# Patient Record
Sex: Male | Born: 1990 | Race: Black or African American | Hispanic: No | Marital: Single | Smoking: Never smoker
Health system: Southern US, Community
[De-identification: ages and names within clinical notes are randomized; demographics above are authoritative.]

## PROBLEM LIST (undated history)

## (undated) DIAGNOSIS — T7840XA Allergy, unspecified, initial encounter: Secondary | ICD-10-CM

## (undated) DIAGNOSIS — R569 Unspecified convulsions: Secondary | ICD-10-CM

## (undated) HISTORY — DX: Allergy, unspecified, initial encounter: T78.40XA

## (undated) HISTORY — DX: Unspecified convulsions: R56.9

---

## 2009-04-12 ENCOUNTER — Emergency Department (HOSPITAL_COMMUNITY): Admission: EM | Admit: 2009-04-12 | Discharge: 2009-04-12 | Payer: Self-pay | Admitting: Family Medicine

## 2013-12-07 ENCOUNTER — Ambulatory Visit (INDEPENDENT_AMBULATORY_CARE_PROVIDER_SITE_OTHER): Payer: 59 | Admitting: Internal Medicine

## 2013-12-07 VITALS — BP 120/84 | HR 62 | Temp 98.0°F | Resp 16 | Ht 71.0 in | Wt 216.0 lb

## 2013-12-07 DIAGNOSIS — H109 Unspecified conjunctivitis: Secondary | ICD-10-CM

## 2013-12-07 MED ORDER — TOBRAMYCIN 0.3 % OP SOLN: 2.0000 [drp] | Freq: Four times a day (QID) | OPHTHALMIC | Status: DC

## 2013-12-07 NOTE — Progress Notes (Signed)
   Subjective:    Patient ID: Lee Edwards, male    DOB: 07-Jan-1991, 23 y.o.   MRN: [id number]  HPI Chief Complaint  Patient presents with  . swollen eyelid    right eye--since yeasterday--itching--taking benadryl   This chart was scribed for Lee Siaobert Akira Perusse, MD by Andrew Auaven Small, ED Scribe. This patient was seen in room 4 and the patient's care was started at 9:50 AM.  HPI Comments: Lee PapasDeandre Disano is a 23 y.o. male who presents to the [facility] complaining of worsening edema to right eye lid. Pt states he had mild swelling to right eye lid last night but woke up this morning with worsening swelling. He reports occasional itching and very mild pain. Pt has taken benadryl. He denies eye drainage.   Past Medical History  Diagnosis Date  . Allergy    Allergies  Allergen Reactions  . Pollen Extract    Prior to Admission medications   Medication Sig Start Date End Date Taking? Authorizing Provider  Loratadine (CLARITIN PO) Take by mouth.   Yes Historical Provider, MD   Review of Systems  Eyes: Positive for pain and itching. Negative for discharge.   Objective:   Physical Exam  Constitutional: He is oriented to person, place, and time. He appears well-developed and well-nourished. No distress.  HENT:  Head: Normocephalic and atraumatic.  Eyes: Conjunctivae and EOM are normal. Pupils are equal, round, and reactive to light. Right conjunctiva is not injected.  Right upper lid is slighty swollen without redness or nodule. Purulent discharge expressed from tear duct of right eye.  Neck: Neck supple.  Cardiovascular: Normal rate.   Pulmonary/Chest: Effort normal.  Musculoskeletal: Normal range of motion.  Neurological: He is alert and oriented to person, place, and time.  Skin: Skin is warm and dry.  Psychiatric: He has a normal mood and affect. His behavior is normal.  Nursing note and vitals reviewed.  Filed Vitals:   12/07/13 0944  BP: 120/84  Pulse: 62    Temp: 98 F (36.7 C)  Resp: 16   Assessment & Plan:   eye/tear duct infection Meds ordered this encounter  Medications  . tobramycin (TOBREX) 0.3 % ophthalmic solution    Sig: Place 2 drops into the right eye every 6 (six) hours.    Dispense:  5 mL    Refill:  0   I personally performed the services described in this documentation, which was scribed in my presence. The recorded information has been reviewed and is accurate. I have completed the patient encounter in its entirety as documented by the scribe, with editing by me where necessary. Tesslyn Baumert P. Merla Richesoolittle, M.D.

## 2014-11-02 ENCOUNTER — Ambulatory Visit (INDEPENDENT_AMBULATORY_CARE_PROVIDER_SITE_OTHER): Payer: 59

## 2014-11-02 ENCOUNTER — Ambulatory Visit (INDEPENDENT_AMBULATORY_CARE_PROVIDER_SITE_OTHER): Payer: 59 | Admitting: Emergency Medicine

## 2014-11-02 ENCOUNTER — Inpatient Hospital Stay: Admission: RE | Admit: 2014-11-02 | Payer: Self-pay | Source: Ambulatory Visit

## 2014-11-02 VITALS — BP 132/88 | HR 87 | Temp 98.7°F | Resp 16 | Ht 71.0 in | Wt 219.9 lb

## 2014-11-02 DIAGNOSIS — IMO0001 Reserved for inherently not codable concepts without codable children: Secondary | ICD-10-CM

## 2014-11-02 DIAGNOSIS — S40011A Contusion of right shoulder, initial encounter: Secondary | ICD-10-CM

## 2014-11-02 DIAGNOSIS — S40021A Contusion of right upper arm, initial encounter: Secondary | ICD-10-CM

## 2014-11-02 DIAGNOSIS — R569 Unspecified convulsions: Secondary | ICD-10-CM | POA: Diagnosis not present

## 2014-11-02 LAB — COMPREHENSIVE METABOLIC PANEL
ALT: 25 U/L (ref 9–46)
AST: 29 U/L (ref 10–40)
Albumin: 4.5 g/dL (ref 3.6–5.1)
Alkaline Phosphatase: 86 U/L (ref 40–115)
BUN: 12 mg/dL (ref 7–25)
CALCIUM: 9.7 mg/dL (ref 8.6–10.3)
CO2: 28 mmol/L (ref 20–31)
Chloride: 103 mmol/L (ref 98–110)
Creat: 1.19 mg/dL (ref 0.60–1.35)
Glucose, Bld: 86 mg/dL (ref 65–99)
POTASSIUM: 4.2 mmol/L (ref 3.5–5.3)
Sodium: 139 mmol/L (ref 135–146)
Total Bilirubin: 0.8 mg/dL (ref 0.2–1.2)
Total Protein: 7.6 g/dL (ref 6.1–8.1)

## 2014-11-02 LAB — CBC
HEMATOCRIT: 41.6 % (ref 39.0–52.0)
HEMOGLOBIN: 14.5 g/dL (ref 13.0–17.0)
MCH: 32 pg (ref 26.0–34.0)
MCHC: 34.9 g/dL (ref 30.0–36.0)
MCV: 91.8 fL (ref 78.0–100.0)
MPV: 9.9 fL (ref 8.6–12.4)
Platelets: 291 10*3/uL (ref 150–400)
RBC: 4.53 MIL/uL (ref 4.22–5.81)
RDW: 13.2 % (ref 11.5–15.5)
WBC: 12.2 10*3/uL — ABNORMAL HIGH (ref 4.0–10.5)

## 2014-11-02 LAB — CALCIUM: Calcium: 9.7 mg/dL (ref 8.6–10.3)

## 2014-11-02 LAB — MAGNESIUM: Magnesium: 1.9 mg/dL (ref 1.5–2.5)

## 2014-11-02 MED ORDER — NAPROXEN SODIUM 550 MG PO TABS: 550.0000 mg | ORAL_TABLET | Freq: Two times a day (BID) | ORAL | Status: DC

## 2014-11-02 NOTE — Progress Notes (Deleted)
[facility] [address], [place] [address] [phone]  Date:  11/02/2014   Name:  Lee Edwards   DOB:  05/20/1990   MRN:  [id number]  PCP:  No PCP Per Patient    Chief Complaint: seizure; Shoulder Pain; and Flu Vaccine   History of Present Illness:  This is a 24 y.o. male with PMH allergic rhinitis who is presenting with complaints of "a seizure" last night, witnessed by his girlfriend.   Review of Systems:  Review of Systems See HPI  There are no active problems to display for this patient.   Prior to Admission medications   Medication Sig Start Date End Date Taking? Authorizing Provider  Loratadine (CLARITIN PO) Take by mouth.   Yes Historical Provider, MD    Allergies  Allergen Reactions  . Pollen Extract     History reviewed. No pertinent past surgical history.  Social History  Substance Use Topics  . Smoking status: Never Smoker   . Smokeless tobacco: None  . Alcohol Use: No    History reviewed. No pertinent family history.  Medication list has been reviewed and updated.  Physical Examination:  Physical Exam  BP 132/88 mmHg  Pulse 87  Temp(Src) 98.7 F (37.1 C) (Oral)  Resp 16  Ht 5\' 11"  (1.803 m)  Wt 219 lb 13.9 oz (99.732 kg)  BMI 30.68 kg/m2  SpO2 98%  Assessment and Plan:

## 2014-11-02 NOTE — Patient Instructions (Addendum)
You have an appointment today at [facility] located at [address] today at noon   YOU MAY NOT DRIVE ANY VEHICLE UNTIL CLEARED BY NEUROLOGIST  Epilepsy Epilepsy is a disorder in which a person has repeated seizures over time. A seizure is a release of abnormal electrical activity in the brain. Seizures can cause a change in attention, behavior, or the ability to remain awake and alert (altered mental status). Seizures often involve uncontrollable shaking (convulsions).  Most people with epilepsy lead normal lives. However, people with epilepsy are at an increased risk of falls, accidents, and injuries. Therefore, it is important to begin treatment right away. CAUSES  Epilepsy has many possible causes. Anything that disturbs the normal pattern of brain cell activity can lead to seizures. This may include:   Head injury.  Birth trauma.  High fever as a child.  Stroke.  Bleeding into or around the brain.  Certain drugs.  Prolonged low oxygen, such as what occurs after CPR efforts.  Abnormal brain development.  Certain illnesses, such as meningitis, encephalitis (brain infection), malaria, and other infections.  An imbalance of nerve signaling chemicals (neurotransmitters).  SIGNS AND SYMPTOMS  The symptoms of a seizure can vary greatly from one person to another. Right before a seizure, you may have a warning (aura) that a seizure is about to occur. An aura may include the following symptoms:  Fear or anxiety.  Nausea.  Feeling like the room is spinning (vertigo).  Vision changes, such as seeing flashing lights or spots. Common symptoms during a seizure include:  Abnormal sensations, such as an abnormal smell or a bitter taste in the mouth.   Sudden, general body stiffness.   Convulsions that involve rhythmic jerking of the face, arm, or leg on one or both sides.   Sudden change in consciousness.   Appearing to be awake but not responding.   Appearing  to be asleep but cannot be awakened.   Grimacing, chewing, lip smacking, drooling, tongue biting, or loss of bowel or bladder control. After a seizure, you may feel sleepy for a while. DIAGNOSIS  Your health care provider will ask about your symptoms and take a medical history. Descriptions from any witnesses to your seizures will be very helpful in the diagnosis. A physical exam, including a detailed neurological exam, is necessary. Various tests may be done, such as:   An electroencephalogram (EEG). This is a painless test of your brain waves. In this test, a diagram is created of your brain waves. These diagrams can be interpreted by a specialist.  An MRI of the brain.   A CT scan of the brain.   A spinal tap (lumbar puncture, LP).  Blood tests to check for signs of infection or abnormal blood chemistry. TREATMENT  There is no cure for epilepsy, but it is generally treatable. Once epilepsy is diagnosed, it is important to begin treatment as soon as possible. For most people with epilepsy, seizures can be controlled with medicines. The following may also be used:  A pacemaker for the brain (vagus nerve stimulator) can be used for people with seizures that are not well controlled by medicine.  Surgery on the brain. For some people, epilepsy eventually goes away. HOME CARE INSTRUCTIONS   Follow your health care provider's recommendations on driving and safety in normal activities.  Get enough rest. Lack of sleep can cause seizures.  Only take over-the-counter or prescription medicines as directed by your health care provider. Take any prescribed medicine exactly  as directed.  Avoid any known triggers of your seizures.  Keep a seizure diary. Record what you recall about any seizure, especially any possible trigger.   Make sure the people you live and work with know that you are prone to seizures. They should receive instructions on how to help you. In general, a witness to a  seizure should:   Cushion your head and body.   Turn you on your side.   Avoid unnecessarily restraining you.   Not place anything inside your mouth.   Call for emergency medical help if there is any question about what has occurred.   Follow up with your health care provider as directed. You may need regular blood tests to monitor the levels of your medicine.  SEEK MEDICAL CARE IF:   You develop signs of infection or other illness. This might increase the risk of a seizure.   You seem to be having more frequent seizures.   Your seizure pattern is changing.  SEEK IMMEDIATE MEDICAL CARE IF:   You have a seizure that does not stop after a few moments.   You have a seizure that causes any difficulty in breathing.   You have a seizure that results in a very severe headache.   You have a seizure that leaves you with the inability to speak or use a part of your body.    This information is not intended to replace advice given to you by your health care provider. Make sure you discuss any questions you have with your health care provider.   Document Released: 01/02/2005 Document Revised: 10/23/2012 Document Reviewed: 08/14/2012 Elsevier Interactive Patient Education [employer]

## 2014-11-02 NOTE — Progress Notes (Signed)
Subjective:  Patient ID: Lee Edwards, male    DOB: [birth year]  Age: 24 y.o. MRN: [id number]  CC: seizure and Shoulder Pain   HPI Lee Edwards presents  with a history of seizure in the past as a child. He had a general seizure this morning. He has not taking any anticonvulsants in his life. He has no antecedent illness or injury no history of injury. He has no neurologic or visual symptoms currently. Said when he had the seizure he fell onto the bed lost continency urine. And injured her shoulder and in the fall. He denies any other complaints.  History Lee Edwards has a past medical history of Allergy.   He has no past surgical history on file.   His  family history is not on file.  He   reports that he has never smoked. He does not have any smokeless tobacco history on file. He reports that he does not drink alcohol or use illicit drugs.  Outpatient Prescriptions Prior to Visit  Medication Sig Dispense Refill  . Loratadine (CLARITIN PO) Take by mouth.    . tobramycin (TOBREX) 0.3 % ophthalmic solution Place 2 drops into the right eye every 6 (six) hours. 5 mL 0   No facility-administered medications prior to visit.    Social History   Social History  . Marital Status: Single    Spouse Name: N/A  . Number of Children: N/A  . Years of Education: N/A   Social History Main Topics  . Smoking status: Never Smoker   . Smokeless tobacco: None  . Alcohol Use: No  . Drug Use: No  . Sexual Activity: Not Asked   Other Topics Concern  . None   Social History Narrative     Review of Systems  Constitutional: Negative for fever, chills and appetite change.  HENT: Negative for congestion, ear pain, postnasal drip, sinus pressure and sore throat.   Eyes: Negative for pain and redness.  Respiratory: Negative for cough, shortness of breath and wheezing.   Cardiovascular: Negative for leg swelling.  Gastrointestinal: Negative for nausea, vomiting, abdominal pain, diarrhea,  constipation and blood in stool.  Endocrine: Negative for polyuria.  Genitourinary: Negative for dysuria, urgency, frequency and flank pain.  Musculoskeletal: Negative for gait problem.  Skin: Negative for rash.  Neurological: Positive for seizures. Negative for weakness and headaches.  Psychiatric/Behavioral: Negative for confusion and decreased concentration. The patient is not nervous/anxious.     Objective:  BP 132/88 mmHg  Pulse 87  Temp(Src) 98.7 F (37.1 C) (Oral)  Resp 16  Ht  (1.803 m)  Wt 219 lb 13.9 oz (99.732 kg)  BMI 30.68 kg/m2  SpO2 98%  Physical Exam  Constitutional: He is oriented to person, place, and time. He appears well-developed and well-nourished. No distress.  HENT:  Head: Normocephalic and atraumatic.  Right Ear: External ear normal.  Left Ear: External ear normal.  Nose: Nose normal.  Eyes: Conjunctivae and EOM are normal. Pupils are equal, round, and reactive to light. No scleral icterus.  Neck: Normal range of motion. Neck supple. No tracheal deviation present.  Cardiovascular: Normal rate, regular rhythm and normal heart sounds.   Pulmonary/Chest: Effort normal. No respiratory distress. He has no wheezes. He has no rales.  Abdominal: He exhibits no mass. There is no tenderness. There is no rebound and no guarding.  Musculoskeletal: He exhibits no edema.       Right shoulder: He exhibits tenderness. He exhibits no swelling, no effusion and no  deformity.  Lymphadenopathy:    He has no cervical adenopathy.  Neurological: He is alert and oriented to person, place, and time. Coordination normal.  Skin: Skin is warm and dry. No rash noted.  Psychiatric: He has a normal mood and affect. His behavior is normal.      Assessment & Plan:   Heron was seen today for seizure and shoulder pain.  Diagnoses and all orders for this visit:  Convulsions, unspecified convulsion type (HCC) -     Comprehensive metabolic panel -     CBC -      Calcium -     Magnesium -     EKG 12-Lead -     Ambulatory referral to Neurology -     MR Brain Wo Contrast; Future -     DG Shoulder Right; Future   I have discontinued Lee Edwards tobramycin. I am also having him maintain his Loratadine (CLARITIN PO).  No orders of the defined types were placed in this encounter.    Appropriate red flag conditions were discussed with the patient as well as actions that should be taken.  Patient expressed his understanding.  Follow-up: Return if symptoms worsen or fail to improve.  Carmelina DaneAnderson, Alishba Naples S, MD   [facility] reading (PRIMARY) by  Dr. Dareen PianoAnderson.  negative.

## 2014-11-08 ENCOUNTER — Ambulatory Visit
Admission: RE | Admit: 2014-11-08 | Discharge: 2014-11-08 | Disposition: A | Discharge status: Home / Self Care | Payer: 59 | Source: Ambulatory Visit | Attending: Emergency Medicine | Admitting: Emergency Medicine

## 2014-11-08 ENCOUNTER — Telehealth: Payer: Self-pay | Admitting: Family Medicine

## 2014-11-08 ENCOUNTER — Other Ambulatory Visit: Payer: Self-pay | Admitting: Family Medicine

## 2014-11-08 DIAGNOSIS — S40011A Contusion of right shoulder, initial encounter: Secondary | ICD-10-CM

## 2014-11-08 DIAGNOSIS — R569 Unspecified convulsions: Secondary | ICD-10-CM

## 2014-11-08 MED ORDER — NAPROXEN SODIUM 550 MG PO TABS: 550.0000 mg | ORAL_TABLET | Freq: Two times a day (BID) | ORAL | Status: DC

## 2014-11-08 NOTE — Addendum Note (Signed)
Addended by: Carmelina DaneANDERSON, Zahari Xiang S on: 11/08/2014 09:54 AM   Modules accepted: Kipp BroodSmartSet

## 2014-11-08 NOTE — Telephone Encounter (Signed)
[facility], need naproxen to be sent to [facility] on [address], done

## 2014-11-16 ENCOUNTER — Encounter: Payer: Self-pay | Admitting: Neurology

## 2014-11-16 ENCOUNTER — Ambulatory Visit (INDEPENDENT_AMBULATORY_CARE_PROVIDER_SITE_OTHER): Payer: 59 | Admitting: Neurology

## 2014-11-16 VITALS — BP 144/96 | HR 57 | Ht 71.0 in | Wt 220.5 lb

## 2014-11-16 DIAGNOSIS — R569 Unspecified convulsions: Secondary | ICD-10-CM

## 2014-11-16 HISTORY — DX: Unspecified convulsions: R56.9

## 2014-11-16 NOTE — Patient Instructions (Addendum)
   We will check EEG evaluation.  Seizure, Adult A seizure is abnormal electrical activity in the brain. Seizures usually last from 30 seconds to 2 minutes. There are various types of seizures. Before a seizure, you may have a warning sensation (aura) that a seizure is about to occur. An aura may include the following symptoms:   Fear or anxiety.  Nausea.  Feeling like the room is spinning (vertigo).  Vision changes, such as seeing flashing lights or spots. Common symptoms during a seizure include:  A change in attention or behavior (altered mental status).  Convulsions with rhythmic jerking movements.  Drooling.  Rapid eye movements.  Grunting.  Loss of bladder and bowel control.  Bitter taste in the mouth.  Tongue biting. After a seizure, you may feel confused and sleepy. You may also have an injury resulting from convulsions during the seizure. HOME CARE INSTRUCTIONS   If you are given medicines, take them exactly as prescribed by your health care provider.  Keep all follow-up appointments as directed by your health care provider.  Do not swim or drive or engage in risky activity during which a seizure could cause further injury to you or others until your health care provider says it is OK.  Get adequate rest.  Teach friends and family what to do if you have a seizure. They should:  Lay you on the ground to prevent a fall.  Put a cushion under your head.  Loosen any tight clothing around your neck.  Turn you on your side. If vomiting occurs, this helps keep your airway clear.  Stay with you until you recover.  Know whether or not you need emergency care. SEEK IMMEDIATE MEDICAL CARE IF:  The seizure lasts longer than 5 minutes.  The seizure is severe or you do not wake up immediately after the seizure.  You have an altered mental status after the seizure.  You are having more frequent or worsening seizures. Someone should drive you to the emergency  department or call local emergency services (911 in [place] MAKE SURE YOU:  Understand these instructions.  Will watch your condition.  Will get help right away if you are not doing well or get worse.   This information is not intended to replace advice given to you by your health care provider. Make sure you discuss any questions you have with your health care provider.   Document Released: 12/31/1999 Document Revised: 01/23/2014 Document Reviewed: 08/14/2012 Elsevier Interactive Patient Education [employer] Else[employer] Inc[employer]

## 2014-11-16 NOTE — Progress Notes (Signed)
Reason for visit:  seizure  Referring physician:  Dr. Sharlot Gowda is a 24 y.o. male  History of present illness:   Lee Edwards is a 24 year old right-handed black male with a history of seizures as a child. The patient indicates that he was 61 or 24 years old when he was having seizures, he does not know much about the history regarding this, but he was never placed on a medications for the seizures. The patient believes that the seizures may have been febrile seizures. He indicates that he has a history of a concussion at age 17 years with brief loss of consciousness. He has not had any seizures until 11/02/2014. The patient has had some issues with insomnia recently. He awakened around 2 AM, he was not able to get back to sleep until about 4 AM. Around that time, he had a witnessed event of generalized jerking, with no tongue biting, but the patient did have urinary incontinence. The patient denies any other issues such as problems with headaches, numbness or weakness on the face, arms, or legs. 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 The patient denies any other family history of seizures. He was seen by his primary care physician, routine blood work to include a comprehensive metabolic profile and CBC were done. He has been set up for MRI evaluation of the brain that was unremarkable. He is sent to this office for an evaluation.  Past Medical History  Diagnosis Date  . Allergy   . Convulsions/seizures (HCC) 11/16/2014    History reviewed. No pertinent past surgical history.  Family History  Problem Relation Age of Onset  . Cancer Mother   . Cancer Father     Social history:  reports that he has never smoked. He does not have any smokeless tobacco history on file. He reports that he does not drink alcohol or use illicit drugs.  Medications:  Prior to Admission medications   Medication Sig Start Date End Date Taking?  Authorizing Provider  Loratadine (CLARITIN PO) Take by mouth.   Yes Historical Provider, MD  naproxen sodium (ANAPROX DS) 550 MG tablet Take 1 tablet (550 mg total) by mouth 2 (two) times daily with a meal. 11/08/14 11/08/15 Yes Thao P Le, DO      Allergies  Allergen Reactions  . Pollen Extract     ROS:  Out of a complete 14 system review of symptoms, the patient complains only of the following symptoms, and all other reviewed systems are negative.   Headache  Insomnia  Seizures  Blood pressure 144/96, pulse 57, height  (1.803 m), weight 220 lb 8 oz (100.018 kg).  Physical Exam  General: The patient is alert and cooperative at the time of the examination.  Eyes: Pupils are equal, round, and reactive to light. Discs are flat bilaterally.  Neck: The neck is supple, no carotid bruits are noted.  Respiratory: The respiratory examination is clear.  Cardiovascular: The cardiovascular examination reveals a regular rate and rhythm, no obvious murmurs or rubs are noted.  Skin: Extremities are without significant edema.  Neurologic Exam  Mental status: The patient is alert and oriented x 3 at the time of the examination. The patient has apparent normal recent and remote memory, with an apparently normal attention span and concentration ability.  Cranial nerves: Facial symmetry is present. There is good sensation of the face to pinprick and soft touch bilaterally.  The strength of the facial muscles and the muscles to head turning and shoulder shrug are normal bilaterally. Speech is well enunciated, no aphasia or dysarthria is noted. Extraocular movements are full. Visual fields are full. The tongue is midline, and the patient has symmetric elevation of the soft palate. No obvious hearing deficits are noted.  Motor: The motor testing reveals 5 over 5 strength of all 4 extremities. Good symmetric motor tone is noted throughout.  Sensory: Sensory testing is intact to pinprick, soft  touch, vibration sensation, and position sense on all 4 extremities. No evidence of extinction is noted.  Coordination: Cerebellar testing reveals good finger-nose-finger and heel-to-shin bilaterally.  Gait and station: Gait is normal. Tandem gait is normal. Romberg is negative. No drift is seen.  Reflexes: Deep tendon reflexes are symmetric and normal bilaterally. Toes are downgoing bilaterally.   Assessment/Plan:   1. Generalized seizure event   The patient may have had febrile seizures as a child, this may put him at high risk for seizures later on in life. The patient will try to get some history regarding the seizures from his parents. The patient will undergo EEG evaluation, he is not to operate a motor vehicle or climb to heights over the next 6 months. He will follow-up in 4 months, sooner if needed. He will have a urine drug screen done today.  Lee Palau. Keith Nyles Mitton MD 11/16/2014 8:33 PM  [facility] [address] [place], [place] [address]  Phone [phone] Fax [phone]

## 2014-11-17 LAB — 733690 12+OXYCODONE+CRT-SCR
AMPHETAMINE SCRN UR: NEGATIVE ng/mL
BENZODIAZEPINE SCREEN, URINE: NEGATIVE ng/mL
Barbiturate Screen, Ur: NEGATIVE ng/mL
CANNABINOIDS UR QL SCN: POSITIVE ng/mL
COCAINE(METAB.) SCREEN, URINE: NEGATIVE ng/mL
Creatinine(Crt), U: 46 mg/dL (ref 20.0–300.0)
FENTANYL, URINE: NEGATIVE pg/mL
MEPERIDINE SCREEN, URINE: NEGATIVE ng/mL
Methadone Scn, Ur: NEGATIVE ng/mL
Opiate Scrn, Ur: NEGATIVE ng/mL
Oxycodone+Oxymorphone Ur Ql Scn: NEGATIVE ng/mL
PCP SCRN UR: NEGATIVE ng/mL
Ph of Urine: 6.6 (ref 4.5–8.9)
Propoxyphene, Screen: NEGATIVE ng/mL
Tramadol Ur Ql Scn: NEGATIVE ng/mL

## 2014-12-18 ENCOUNTER — Other Ambulatory Visit: Payer: 59

## 2015-01-21 ENCOUNTER — Encounter (HOSPITAL_COMMUNITY): Payer: Self-pay | Admitting: Emergency Medicine

## 2015-01-21 ENCOUNTER — Emergency Department (HOSPITAL_COMMUNITY)
Admission: EM | Admit: 2015-01-21 | Discharge: 2015-01-21 | Disposition: A | Discharge status: Home / Self Care | Payer: 59 | Attending: Emergency Medicine | Admitting: Emergency Medicine

## 2015-01-21 DIAGNOSIS — R569 Unspecified convulsions: Secondary | ICD-10-CM

## 2015-01-21 DIAGNOSIS — M25511 Pain in right shoulder: Secondary | ICD-10-CM | POA: Diagnosis not present

## 2015-01-21 LAB — CBG MONITORING, ED: GLUCOSE-CAPILLARY: 94 mg/dL (ref 65–99)

## 2015-01-21 MED ORDER — IBUPROFEN 600 MG PO TABS: 600.0000 mg | ORAL_TABLET | Freq: Three times a day (TID) | ORAL | Status: AC | PRN

## 2015-01-21 MED ORDER — LEVETIRACETAM 500 MG PO TABS
1000.0000 mg | ORAL_TABLET | Freq: Once | ORAL | Status: AC
  Administered 2015-01-21: 1000 mg via ORAL
  Filled 2015-01-21: qty 2

## 2015-01-21 MED ORDER — LEVETIRACETAM 500 MG PO TABS: 500.0000 mg | ORAL_TABLET | Freq: Two times a day (BID) | ORAL | Status: DC

## 2015-01-21 MED ORDER — OXYCODONE-ACETAMINOPHEN 5-325 MG PO TABS
1.0000 | ORAL_TABLET | Freq: Once | ORAL | Status: AC
  Administered 2015-01-21: 1 via ORAL
  Filled 2015-01-21: qty 1

## 2015-01-21 MED ORDER — IBUPROFEN 200 MG PO TABS
600.0000 mg | ORAL_TABLET | Freq: Once | ORAL | Status: AC
  Administered 2015-01-21: 600 mg via ORAL
  Filled 2015-01-21: qty 3

## 2015-01-21 NOTE — ED Provider Notes (Signed)
CSN: [id number]     Arrival date & time 01/21/15  0547 History   First MD Initiated Contact with Patient 01/21/15 [phone]     Chief Complaint  Patient presents with  . Seizures    HPI Patient presents to the emergency department with possible seizure this morning.  He had a history of possible febrile seizures when he was a child but had no other seizures throughout adolescence or early adulthood.  He reports in October 2016 he had a seizure in the middle night with a classic postictal period.  He was seen in urgent care and worked up with labs.  He followed up with neurology who didn't MRI which was without abnormality.  He has an EEG scheduled for the future.  He has seen Dr. Lesia Sago at [facility] neurology.  He is currently not on antiseizure medications.  His friend reports that the patient began having a full body shaking episode at approximately 4 AM.  This lasted for about 2 minutes followed by a 25 minute postictal period.  The patient reports right-sided moderate shoulder pain at this time.  This is similar to his prior seizure where he awoke with right-sided shoulder pain as well.  Previously he had x-rays performed of his right shoulder which demonstrated no fracture.  His partner reports that his right arm is contorted in an abnormal position when he has the shaking episode.  Currently the patient is only complaining of pain in his right shoulder.  He has no headache.  No recent fever or chills.  No recent illness.  He did report that he was awake from 2 AM to approximate 4 AM in his been struggling with insomnia lately.  This episode is very similar to the episode that occurred in October.   Past Medical History  Diagnosis Date  . Allergy   . Convulsions/seizures (HCC) 11/16/2014   History reviewed. No pertinent past surgical history. Family History  Problem Relation Age of Onset  . Cancer Mother   . Cancer Father    Social History  Substance Use Topics  . Smoking status: Never  Smoker   . Smokeless tobacco: None  . Alcohol Use: 0.0 oz/week    0 Standard drinks or equivalent per week     Comment: occ    Review of Systems  All other systems reviewed and are negative.     Allergies  Pollen extract  Home Medications   Prior to Admission medications   Medication Sig Start Date End Date Taking? Authorizing Provider  loratadine (CLARITIN) 10 MG tablet Take 10 mg by mouth daily as needed for allergies.   Yes Historical Provider, MD  Pseudoephedrine-APAP-DM (DAYQUIL PO) Take 1 Dose by mouth daily as needed (cold symptoms).   Yes Historical Provider, MD  ibuprofen (ADVIL,MOTRIN) 600 MG tablet Take 1 tablet (600 mg total) by mouth every 8 (eight) hours as needed. 01/21/15   Azalia Bilis, MD  levETIRAcetam (KEPPRA) 500 MG tablet Take 1 tablet (500 mg total) by mouth 2 (two) times daily. 01/21/15   Azalia Bilis, MD   BP 126/78 mmHg  Pulse 93  Temp(Src) 98.3 F (36.8 C) (Oral)  Resp 12  Ht 5\' 10"  (1.778 m)  Wt 215 lb (97.523 kg)  BMI 30.85 kg/m2  SpO2 100% Physical Exam  Constitutional: He is oriented to person, place, and time. He appears well-developed and well-nourished.  HENT:  Head: Normocephalic and atraumatic.  Eyes: EOM are normal.  Neck: Normal range of motion.  Cardiovascular: Normal  rate, regular rhythm, normal heart sounds and intact distal pulses.   Pulmonary/Chest: Effort normal and breath sounds normal. No respiratory distress.  Abdominal: Soft. He exhibits no distension. There is no tenderness.  Musculoskeletal: Normal range of motion.  Mild pain with active range of motion of right shoulder.  Pain minimized with passive range of motion the right shoulder.  Normal right radial pulse.  No obvious deformity of the right upper extremity  Neurological: He is alert and oriented to person, place, and time.  Skin: Skin is warm and dry.  Psychiatric: He has a normal mood and affect. Judgment normal.  Nursing note and vitals reviewed.   ED Course   Procedures (including critical care time) Labs Review Labs Reviewed  CBG MONITORING, ED  CBG MONITORING, ED    Imaging Review No results found. I have personally reviewed and evaluated these images and lab results as part of my medical decision-making.   EKG Interpretation None      MDM   Final diagnoses:  Seizure [facility](HCC)    Patient with what sounds like a seizure disorder.  Given that his second seizures occurred less than 6 months the patient be started on Keppra and asked to follow-up with neurology.  I've asked that he call the neurology office today for follow-up.  I've asked that when he call to ask the provider whether or not he wants to continue the Keppra or get the EEG first.  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  Standard seizure precautions given.  Driving restrictions.  Home with a sling for the right upper extremity and anti-inflammatories.  No indication for imaging today.  Normal neurologic exam.    Azalia BilisKevin Luvada Salamone, MD 01/21/15 [phone]

## 2015-01-21 NOTE — Discharge Instructions (Signed)

## 2015-01-21 NOTE — ED Notes (Signed)
Pt states he had a seizure this morning   Visitor states it started about 0419 and lasted 1.5 to 2 minutes  States his eyes were rolled back in his head, foaming at the mouth, and clinching, arms and legs involved  States it took him about 20 minutes to become back to normal  States he has had one seizure in the past and is following up with neurology and is scheduled for some testing next month  Pt states he is not currently on any medications

## 2015-01-22 ENCOUNTER — Other Ambulatory Visit: Payer: 59

## 2015-01-26 ENCOUNTER — Telehealth: Payer: Self-pay

## 2015-01-26 NOTE — Telephone Encounter (Signed)
I called the patient and left a voicemail asking him to call back and schedule an appointment within the next week or so. Patient may be scheduled with Megan. Please schedule patient when he calls back.

## 2015-01-27 NOTE — Telephone Encounter (Signed)
I called the patient. Appointment scheduled with Aundra MilletMegan, NP 02/01/15.

## 2015-01-29 ENCOUNTER — Ambulatory Visit (INDEPENDENT_AMBULATORY_CARE_PROVIDER_SITE_OTHER): Payer: 59 | Admitting: Neurology

## 2015-01-29 ENCOUNTER — Telehealth: Payer: Self-pay | Admitting: Neurology

## 2015-01-29 ENCOUNTER — Ambulatory Visit: Payer: 59 | Admitting: Adult Health

## 2015-01-29 DIAGNOSIS — R569 Unspecified convulsions: Secondary | ICD-10-CM | POA: Diagnosis not present

## 2015-01-29 NOTE — Telephone Encounter (Signed)
I called patient. The EEG study shows epileptiform activity, bifrontal predominance. Consistent with generalized epilepsy. The patient's been placed on Keppra, he is to remain on this medication. He is applying for short-term disability

## 2015-01-29 NOTE — Procedures (Signed)
     History: Lee Edwards is a 25 year old gentleman with a history of febrile seizures as a child, the patient is having episodes of seizure-type events over the last several months, recently was seen in the emergency room on 01/21/2015. The patient is being evaluated for seizures.  This is a routine EEG. No skull defects are noted. Medications include ibuprofen, Keppra, Claritin, and DayQuil.  EEG classification: Dysrhythmia grade 3 generalized  Description of the recording: The background rhythms of this recording consists of a well modulated medium amplitude alpha rhythm of 10 Hz that is reactive to eye opening and closure. As the record progresses, intermittent spontaneous episodes of 5 Hz theta slowing is seen with bifrontal maximum intermittently lasting up to 1.5 seconds. These episodes are seen relatively frequently during the initial phases of the recording. Photic inhalation is performed, this results in a bilateral and symmetric photic driving response. Hyperventilation is then performed, resulting in a minimal buildup of background rhythm activities without significant slowing seen. During hyperventilation, the episodes of frontal slowing are more frequent, and are also associated with spike wave and sharp and slow-wave complexes at a frequency of 2.5 Hz. These episodes again last about one second. The episodes have bifrontal predominance. At no time during the recording does there appear to be evidence of focal slowing. EKG monitor shows no evidence of cardiac rhythm abnormalities with a heart rate of 66. The patient remains in the waking state throughout the recording.  Impression: This is an abnormal EEG recording secondary to spike wave and sharp and slow-wave complexes with bifrontal predominance occurring in a generalized distribution. 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

## 2015-02-01 ENCOUNTER — Ambulatory Visit (INDEPENDENT_AMBULATORY_CARE_PROVIDER_SITE_OTHER): Payer: 59 | Admitting: Adult Health

## 2015-02-01 ENCOUNTER — Encounter: Payer: Self-pay | Admitting: Adult Health

## 2015-02-01 VITALS — BP 139/90 | HR 78 | Ht 70.0 in | Wt 220.5 lb

## 2015-02-01 DIAGNOSIS — R569 Unspecified convulsions: Secondary | ICD-10-CM | POA: Diagnosis not present

## 2015-02-01 NOTE — Progress Notes (Signed)
PATIENT: Lee Edwards DOB: 23-Feb-1990  REASON FOR VISIT: follow up- seizures HISTORY FROM: patient  HISTORY OF PRESENT ILLNESS: Lee Edwards is a 25 year old male with a history of seizures. He returns today for follow-up. The patient had a recurrent seizure in October and then again in January. He was placed on Keppra 500 mg twice a day. Both seizures presented similarly. The patient had only had a couple hours of sleep and had a seizure in his sleep. It was witnessed by his girlfriend-she reported that he was shaking in all 4 extremities. The patient was unaware of these events. Since starting Keppra he's been tolerating this medication well. He does report feeling tired but denies any changes with his mood or behavior. The patient is not operating a motor vehicle at this time. He was working at [employer] but is now on short-term disability for his seizures. He denies any new neurological symptoms. He returns today for an evaluation.  HISTORY 11/16/14 (WILLIS): Lee Edwards is a 25 year old right-handed black male with a history of seizures as a child. The patient indicates that he was 89 or 25 years old when he was having seizures, he does not know much about the history regarding this, but he was never placed on a medications for the seizures. The patient believes that the seizures may have been febrile seizures. He indicates that he has a history of a concussion at age 89 years with brief loss of consciousness. He has not had any seizures until 11/02/2014. The patient has had some issues with insomnia recently. He awakened around 2 AM, he was not able to get back to sleep until about 4 AM. Around that time, he had a witnessed event of generalized jerking, with no tongue biting, but the patient did have urinary incontinence. The patient denies any other issues such as problems with headaches, numbness or weakness on the face, arms, or legs. 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 The patient denies any other family history of seizures. He was seen by his primary care physician, routine blood work to include a comprehensive metabolic profile and CBC were done. He has been set up for MRI evaluation of the brain that was unremarkable. He is sent to this office for an evaluation.  REVIEW OF SYSTEMS: Out of a complete 14 system review of symptoms, the patient complains only of the following symptoms, and all other reviewed systems are negative.  Aching muscles  ALLERGIES: Allergies  Allergen Reactions  . Pollen Extract     HOME MEDICATIONS: Outpatient Prescriptions Prior to Visit  Medication Sig Dispense Refill  . ibuprofen (ADVIL,MOTRIN) 600 MG tablet Take 1 tablet (600 mg total) by mouth every 8 (eight) hours as needed. 15 tablet 0  . levETIRAcetam (KEPPRA) 500 MG tablet Take 1 tablet (500 mg total) by mouth 2 (two) times daily. 60 tablet 0  . loratadine (CLARITIN) 10 MG tablet Take 10 mg by mouth daily as needed for allergies.    . Pseudoephedrine-APAP-DM (DAYQUIL PO) Take 1 Dose by mouth daily as needed (cold symptoms).     No facility-administered medications prior to visit.    PAST MEDICAL HISTORY: Past Medical History  Diagnosis Date  . Allergy   . Convulsions/seizures (HCC) 11/16/2014    PAST SURGICAL HISTORY: History reviewed. No pertinent past surgical history.  FAMILY HISTORY: Family History  Problem Relation Age of Onset  . Cancer Mother   . Cancer  Father     SOCIAL HISTORY: Social History   Social History  . Marital Status: Single    Spouse Name: N/A  . Number of Children: 0  . Years of Education: student   Occupational History  . [employer]    Social History Main Topics  . Smoking status: Never Smoker   . Smokeless tobacco: Not on file  . Alcohol Use: 0.0 oz/week    0 Standard drinks or equivalent per week     Comment: occ  . Drug Use: Yes    Special: Marijuana  . Sexual Activity: Not on file   Other  Topics Concern  . Not on file   Social History Narrative   Patient drinks 1 soda daily.   Patient is right handed.       PHYSICAL EXAM  Filed Vitals:   02/01/15 0854  BP: 139/90  Pulse: 78  Height: 5\' 10"  (1.778 m)  Weight: 220 lb 8 oz (100.018 kg)   Body mass index is 31.64 kg/(m^2).  Generalized: Well developed, in no acute distress   Neurological examination  Mentation: Alert oriented to time, place, history taking. Follows all commands speech and language fluent Cranial nerve II-XII: Pupils were equal round reactive to light. Extraocular movements were full, visual field were full on confrontational test. Facial sensation and strength were normal. Uvula tongue midline. Head turning and shoulder shrug  were normal and symmetric. Motor: The motor testing reveals 5 over 5 strength of all 4 extremities. Good symmetric motor tone is noted throughout.  Sensory: Sensory testing is intact to soft touch on all 4 extremities. No evidence of extinction is noted.  Coordination: Cerebellar testing reveals good finger-nose-finger and heel-to-shin bilaterally.  Gait and station: Gait is normal. Tandem gait is normal. Romberg is negative. No drift is seen.  Reflexes: Deep tendon reflexes are symmetric and normal bilaterally.   DIAGNOSTIC DATA (LABS, IMAGING, TESTING) - I reviewed patient records, labs, notes, testing and imaging myself where available.  Lab Results  Component Value Date   WBC 12.2* 11/02/2014   HGB 14.5 11/02/2014   HCT 41.6 11/02/2014   MCV 91.8 11/02/2014   PLT 291 11/02/2014      Component Value Date/Time   NA 139 11/02/2014 1050   K 4.2 11/02/2014 1050   CL 103 11/02/2014 1050   CO2 28 11/02/2014 1050   GLUCOSE 86 11/02/2014 1050   BUN 12 11/02/2014 1050   CREATININE 1.19 11/02/2014 1050   CALCIUM 9.7 11/02/2014 1050   CALCIUM 9.7 11/02/2014 1050   PROT 7.6 11/02/2014 1050   ALBUMIN 4.5 11/02/2014 1050   AST 29 11/02/2014 1050   ALT 25 11/02/2014 1050    ALKPHOS 86 11/02/2014 1050   BILITOT 0.8 11/02/2014 1050      ASSESSMENT AND PLAN 25 y.o. year old male  has a past medical history of Allergy and Convulsions/seizures (HCC) (11/16/2014). here with:  1. Seizures  The patient had an additional seizure in January. He was started on Keppra 500 mg twice a day. He is tolerating this medication well. Patient was advised that he will continue taking this medication. I reviewed possible triggers for seizures such as sleep deprivation. Patient advised that he should not operate a motor vehicle until he is seizure-free for 6 months. He should not operate machinery or heavy equipment. He should also not climb ladders or work on elevated surfaces. Patient verbalized understanding. He will return in 4-5 months or sooner if needed.     Aundra Millet  Ethelene Browns, MSN, NP-C 02/01/2015, 9:07 AM [facility] [address], [address] [place], [place] [address] [phone]

## 2015-02-01 NOTE — Patient Instructions (Signed)
Continue Keppra 500 mg twice a day No driving  Do not climb ladders or work on elevated surfaces Do not operate heavy equipment or machinery If your symptoms worsen or you develop new symptoms please let us know.

## 2015-02-01 NOTE — Progress Notes (Signed)
I have read the note, and I agree with the clinical assessment and plan.  Dimitri Shakespeare KEITH   

## 2015-02-04 ENCOUNTER — Telehealth: Payer: Self-pay | Admitting: *Deleted

## 2015-02-04 NOTE — Telephone Encounter (Signed)
Patient form on [employer] desk. 

## 2015-02-04 NOTE — Telephone Encounter (Signed)
Patient [employer] form on [employer].

## 2015-02-05 DIAGNOSIS — Z0289 Encounter for other administrative examinations: Secondary | ICD-10-CM

## 2015-02-05 NOTE — Telephone Encounter (Signed)
Placed form back on Kelby's desk b/c it is a short-term disability form.

## 2015-02-08 ENCOUNTER — Telehealth: Payer: Self-pay | Admitting: *Deleted

## 2015-02-08 NOTE — Telephone Encounter (Signed)
Patient form faxed on 02/08/15 to [employer].

## 2015-02-08 NOTE — Telephone Encounter (Signed)
Pt called said he needs the forms faxed to [facility] by tomorrow. He said Monia Pouch is making a decision on Wednesday

## 2015-03-18 ENCOUNTER — Ambulatory Visit: Payer: 59 | Admitting: Adult Health

## 2015-03-25 ENCOUNTER — Ambulatory Visit: Payer: 59 | Admitting: Adult Health

## 2015-05-31 ENCOUNTER — Encounter: Payer: Self-pay | Admitting: Adult Health

## 2015-05-31 ENCOUNTER — Ambulatory Visit (INDEPENDENT_AMBULATORY_CARE_PROVIDER_SITE_OTHER): Payer: 59 | Admitting: Adult Health

## 2015-05-31 VITALS — BP 134/92 | HR 81 | Wt 231.4 lb

## 2015-05-31 DIAGNOSIS — R569 Unspecified convulsions: Secondary | ICD-10-CM

## 2015-05-31 MED ORDER — LEVETIRACETAM ER 500 MG PO TB24: 1000.0000 mg | ORAL_TABLET | Freq: Every day | ORAL | Status: DC

## 2015-05-31 NOTE — Progress Notes (Signed)
I have read the note, and I agree with the clinical assessment and plan.  Lee Edwards   

## 2015-05-31 NOTE — Progress Notes (Signed)
PATIENT: Lee Edwards DOB: Jun 22, 1990  REASON FOR VISIT: follow up- seiures HISTORY FROM: patient  HISTORY OF PRESENT ILLNESS: Lee Edwards is a 25 year old male with a history of seizures. He returns today for follow-up. He is currently taking Keppra 500 mg twice a day. He reports that he has not had any additional seizure events. He does state occasionally he will forget to take his evening dose. Unfortunately this has not caused him to have a seizure. He is able to complete all ADLs independently. He is currently not operating a motor vehicle. He denies any changes with his gait or balance. Denies any changes with his mood. The patient would like to resume work. He currently works for [employer] in [employer]. His job does not require him to drive a vehicle. He returns today for an evaluation.  HISTORY 02/01/15 (MM): Lee Edwards is a 25 year old male with a history of seizures. He returns today for follow-up. The patient had a recurrent seizure in October and then again in January. He was placed on Keppra 500 mg twice a day. Both seizures presented similarly. The patient had only had a couple hours of sleep and had a seizure in his sleep. It was witnessed by his girlfriend-she reported that he was shaking in all 4 extremities. The patient was unaware of these events. Since starting Keppra he's been tolerating this medication well. He does report feeling tired but denies any changes with his mood or behavior. The patient is not operating a motor vehicle at this time. He was working at [employer] but is now on short-term disability for his seizures. He denies any new neurological symptoms. He returns today for an evaluation.  HISTORY 11/16/14 (WILLIS): Lee Edwards is a 25 year old right-handed black male with a history of seizures as a child. The patient indicates that he was 43 or 25 years old when he was having seizures, he does not know much about the history regarding this, but he was never placed on a  medications for the seizures. The patient believes that the seizures may have been febrile seizures. He indicates that he has a history of a concussion at age 65 years with brief loss of consciousness. He has not had any seizures until 11/02/2014. The patient has had some issues with insomnia recently. He awakened around 2 AM, he was not able to get back to sleep until about 4 AM. Around that time, he had a witnessed event of generalized jerking, with no tongue biting, but the patient did have urinary incontinence. The patient denies any other issues such as problems with headaches, numbness or weakness on the face, arms, or legs. 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 The patient denies any other family history of seizures. He was seen by his primary care physician, routine blood work to include a comprehensive metabolic profile and CBC were done. He has been set up for MRI evaluation of the brain that was unremarkable. He is sent to this office for an evaluation.  REVIEW OF SYSTEMS: Out of a complete 14 system review of symptoms, the patient complains only of the following symptoms, and all other reviewed systems are negative.  See history of present illness  ALLERGIES: Allergies  Allergen Reactions  . Pollen Extract     HOME MEDICATIONS: Outpatient Prescriptions Prior to Visit  Medication Sig Dispense Refill  . ibuprofen (ADVIL,MOTRIN) 600 MG tablet Take 1 tablet (600 mg total) by mouth  every 8 (eight) hours as needed. 15 tablet 0  . loratadine (CLARITIN) 10 MG tablet Take 10 mg by mouth daily as needed for allergies.    . Pseudoephedrine-APAP-DM (DAYQUIL PO) Take 1 Dose by mouth daily as needed (cold symptoms).    Marland Kitchen. levETIRAcetam (KEPPRA) 500 MG tablet Take 1 tablet (500 mg total) by mouth 2 (two) times daily. 60 tablet 0   No facility-administered medications prior to visit.    PAST MEDICAL HISTORY: Past Medical History  Diagnosis Date    . Allergy   . Convulsions/seizures (HCC) 11/16/2014    PAST SURGICAL HISTORY: History reviewed. No pertinent past surgical history.  FAMILY HISTORY: Family History  Problem Relation Age of Onset  . Cancer Mother   . Cancer Father     SOCIAL HISTORY: Social History   Social History  . Marital Status: Single    Spouse Name: N/A  . Number of Children: 0  . Years of Education: student   Occupational History  . [employer]    Social History Main Topics  . Smoking status: Never Smoker   . Smokeless tobacco: Not on file  . Alcohol Use: 0.0 oz/week    0 Standard drinks or equivalent per week     Comment: occ  . Drug Use: Yes    Special: Marijuana  . Sexual Activity: Not on file   Other Topics Concern  . Not on file   Social History Narrative   Patient drinks 1 soda daily.   Patient is right handed.       PHYSICAL EXAM  Filed Vitals:   05/31/15 0930  BP: 134/92  Pulse: 81  Weight: 231 lb 6.4 oz (104.962 kg)   Body mass index is 33.2 kg/(m^2).  Generalized: Well developed, in no acute distress   Neurological examination  Mentation: Alert oriented to time, place, history taking. Follows all commands speech and language fluent Cranial nerve II-XII: Pupils were equal round reactive to light. Extraocular movements were full, visual field were full on confrontational test. Facial sensation and strength were normal. Uvula tongue midline. Head turning and shoulder shrug  were normal and symmetric. Motor: The motor testing reveals 5 over 5 strength of all 4 extremities. Good symmetric motor tone is noted throughout.  Sensory: Sensory testing is intact to soft touch on all 4 extremities. No evidence of extinction is noted.  Coordination: Cerebellar testing reveals good finger-nose-finger and heel-to-shin bilaterally.  Gait and station: Gait is normal. Tandem gait is normal. Romberg is negative. No drift is seen.  Reflexes: Deep tendon reflexes are symmetric and normal  bilaterally.   DIAGNOSTIC DATA (LABS, IMAGING, TESTING) - I reviewed patient records, labs, notes, testing and imaging myself where available.  Lab Results  Component Value Date   WBC 12.2* 11/02/2014   HGB 14.5 11/02/2014   HCT 41.6 11/02/2014   MCV 91.8 11/02/2014   PLT 291 11/02/2014      Component Value Date/Time   NA 139 11/02/2014 1050   K 4.2 11/02/2014 1050   CL 103 11/02/2014 1050   CO2 28 11/02/2014 1050   GLUCOSE 86 11/02/2014 1050   BUN 12 11/02/2014 1050   CREATININE 1.19 11/02/2014 1050   CALCIUM 9.7 11/02/2014 1050   CALCIUM 9.7 11/02/2014 1050   PROT 7.6 11/02/2014 1050   ALBUMIN 4.5 11/02/2014 1050   AST 29 11/02/2014 1050   ALT 25 11/02/2014 1050   ALKPHOS 86 11/02/2014 1050   BILITOT 0.8 11/02/2014 1050     ASSESSMENT AND  PLAN 25 y.o. year old male  has a past medical history of Allergy and Convulsions/seizures (HCC) (11/16/2014). here with:  1. Seizures  Overall the patient is doing well. He will be switched to Keppra XR 500 mg 2 tablets daily to promote consistent use.  Patient has been advised that if he has any seizure events they should let us know. The patient should not resume driving until July providing that he remain seizure free. The patient is free to return to work. 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     Butch Penny, MSN, NP-C 05/31/2015, 9:31 AM [facility] [address] [place], [place] [address] [phone]

## 2015-05-31 NOTE — Patient Instructions (Addendum)
Try Keppra XR 500 mg 2 tablets daily. If you have any seizure events please let us know.

## 2015-12-02 ENCOUNTER — Ambulatory Visit: Payer: 59 | Admitting: Adult Health

## 2016-01-27 ENCOUNTER — Encounter: Payer: Self-pay | Admitting: Adult Health

## 2016-01-27 ENCOUNTER — Ambulatory Visit (INDEPENDENT_AMBULATORY_CARE_PROVIDER_SITE_OTHER): Payer: 59 | Admitting: Adult Health

## 2016-01-27 VITALS — BP 149/92 | HR 93 | Wt 232.0 lb

## 2016-01-27 DIAGNOSIS — R569 Unspecified convulsions: Secondary | ICD-10-CM

## 2016-01-27 MED ORDER — LEVETIRACETAM ER 500 MG PO TB24: 1000.0000 mg | ORAL_TABLET | Freq: Every day | ORAL | 11 refills | Status: DC
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
## 2016-01-27 NOTE — Progress Notes (Signed)
I have read the note, and I agree with the clinical assessment and plan.  Lee Edwards KEITH   

## 2016-01-27 NOTE — Patient Instructions (Signed)
Continue Keppra XR 1000 mg daily  No driving till seizure free for 6 months If you have any seizure events please let us know.

## 2016-01-31 ENCOUNTER — Encounter: Payer: Self-pay | Admitting: Adult Health

## 2016-01-31 ENCOUNTER — Telehealth: Payer: Self-pay | Admitting: Adult Health

## 2016-01-31 NOTE — Telephone Encounter (Signed)
I called mobile, VM full.  Could not LM. Home # no answer.

## 2016-01-31 NOTE — Telephone Encounter (Signed)
I tried called pt on mobile, mailbox full.

## 2016-01-31 NOTE — Telephone Encounter (Signed)
Pt called to advise he is going to drop a form off today reg the profile.

## 2016-01-31 NOTE — Telephone Encounter (Signed)
Patient called to see if we do a shaving profile.  Patient is to come in be evluated with physician then paperwork will be sent to patients employer HR .  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  Please call

## 2016-02-01 ENCOUNTER — Encounter: Payer: Self-pay | Admitting: Adult Health

## 2016-02-01 NOTE — Telephone Encounter (Signed)
Pt returned RN's call °

## 2016-02-01 NOTE — Telephone Encounter (Signed)
Attempted to call pt and could not LM VM full.  Received form from him.  We see pt for seizures, not skin condition.  He may need to take to pcp or dermatologist if he see's one.

## 2016-02-01 NOTE — Telephone Encounter (Signed)
Patient called office and was notified of Sandra's note recommending patient see pcp or dermatologist for skin condition.  Patient understood and was understanding.

## 2016-02-18 ENCOUNTER — Telehealth: Payer: Self-pay

## 2016-02-18 NOTE — Telephone Encounter (Signed)
[employer] disability forms completed, signed and returned to medical records for payment/processing. No driving until 6 months seizure-free. Last seizure was Oct 2017 and will be re-assessed at appt in April 2018.

## 2016-02-22 DIAGNOSIS — Z0289 Encounter for other administrative examinations: Secondary | ICD-10-CM

## 2016-03-08 ENCOUNTER — Telehealth: Payer: Self-pay | Admitting: Neurology

## 2016-03-08 NOTE — Telephone Encounter (Signed)
Patient is calling to get an order for lab work to prove to his disability case manager that he has seizures.

## 2016-03-08 NOTE — Telephone Encounter (Signed)
Called and spoke to pt. Advised he can request office note to submit. He stated he already submitted office notes and EEG from 01/29/15, but they are requesting more up to date information. He states they are requesting labs showing he has seizures. I advised there is no lab to confirm he has seizures. We do blood work to check level of medication in his system. EEG shows any abnormal electrical activity in the brain. He may have to repeat this test.   Advised I will send message to CW,MD to advise. He verbalized understanding.

## 2016-03-08 NOTE — Telephone Encounter (Signed)
I called patient. The patient has apparent indicated that he needs some sort of blood work or lab tests to prove that he has seizures, I am not exactly sure what he is talking about. I will be happy to provide a letter to this issue to indicate that this is his diagnosis.  He is to contact our office if he needs a letter.

## 2016-03-09 ENCOUNTER — Encounter: Payer: Self-pay | Admitting: Neurology

## 2016-03-09 NOTE — Telephone Encounter (Signed)
Called and LVM for pt that letter ready to pick up at front desk. Gave [facility] phone number if he has further questions/concerns.

## 2016-03-09 NOTE — Telephone Encounter (Signed)
Patient is calling back stating he would like a letter saying he does have seizures according to his last EEG. Please call patient at [phone] when ready for pickup.

## 2016-03-09 NOTE — Telephone Encounter (Signed)
Dr Anne HahnWillis- pt is requesting letter as previously discussed, thank you

## 2016-03-09 NOTE — Telephone Encounter (Signed)
I will write a letter.

## 2016-03-14 ENCOUNTER — Encounter: Payer: Self-pay | Admitting: Adult Health

## 2016-03-15 ENCOUNTER — Encounter: Payer: Self-pay | Admitting: Neurology

## 2016-04-26 ENCOUNTER — Encounter: Payer: Self-pay | Admitting: Adult Health

## 2016-04-26 ENCOUNTER — Ambulatory Visit (INDEPENDENT_AMBULATORY_CARE_PROVIDER_SITE_OTHER): Payer: 59 | Admitting: Adult Health

## 2016-04-26 VITALS — BP 140/84 | HR 74

## 2016-04-26 DIAGNOSIS — R569 Unspecified convulsions: Secondary | ICD-10-CM

## 2016-04-26 MED ORDER — LEVETIRACETAM ER 500 MG PO TB24: 1000.0000 mg | ORAL_TABLET | Freq: Every day | ORAL | 11 refills | Status: AC
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
## 2016-04-26 NOTE — Progress Notes (Signed)
I have read the note, and I agree with the clinical assessment and plan.  WILLIS,CHARLES KEITH   

## 2016-04-26 NOTE — Patient Instructions (Signed)
Continue Keppra ER 1000 mg daily Call for any seizure activity If your symptoms worsen or you develop new symptoms please let us know.

## 2016-04-26 NOTE — Progress Notes (Signed)
Aundra Millet, NP asked me to write a letter for this pt that allows him to return to work but to avoid operating heavy machinery and working at heights. Megan signed this letter and it was given to the pt.

## 2016-08-07 ENCOUNTER — Encounter: Payer: Self-pay | Admitting: Neurology

## 2016-08-07 ENCOUNTER — Telehealth: Payer: Self-pay | Admitting: *Deleted

## 2016-08-07 ENCOUNTER — Encounter: Payer: Self-pay | Admitting: Adult Health

## 2016-08-07 NOTE — Telephone Encounter (Signed)
error/fim. 

## 2016-11-06 ENCOUNTER — Ambulatory Visit: Payer: 59 | Admitting: Neurology

## 2016-11-06 ENCOUNTER — Telehealth: Payer: Self-pay | Admitting: Neurology

## 2016-11-06 NOTE — Telephone Encounter (Signed)
This patient did not show for a revisit appointment today. 

## 2016-11-07 ENCOUNTER — Encounter: Payer: Self-pay | Admitting: Neurology
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
